# Patient Record
Sex: Male | Born: 1960 | Marital: Married | Smoking: Former smoker
Health system: Southern US, Community
[De-identification: ages and names within clinical notes are randomized; demographics above are authoritative.]

## PROBLEM LIST (undated history)

## (undated) DIAGNOSIS — E119 Type 2 diabetes mellitus without complications: Secondary | ICD-10-CM

## (undated) DIAGNOSIS — E669 Obesity, unspecified: Secondary | ICD-10-CM

## (undated) DIAGNOSIS — I10 Essential (primary) hypertension: Secondary | ICD-10-CM

## (undated) DIAGNOSIS — Q059 Spina bifida, unspecified: Secondary | ICD-10-CM

## (undated) DIAGNOSIS — Z8619 Personal history of other infectious and parasitic diseases: Secondary | ICD-10-CM

## (undated) DIAGNOSIS — R011 Cardiac murmur, unspecified: Secondary | ICD-10-CM

## (undated) DIAGNOSIS — L0390 Cellulitis, unspecified: Secondary | ICD-10-CM

## (undated) DIAGNOSIS — E291 Testicular hypofunction: Secondary | ICD-10-CM

## (undated) DIAGNOSIS — E785 Hyperlipidemia, unspecified: Secondary | ICD-10-CM

## (undated) DIAGNOSIS — G4730 Sleep apnea, unspecified: Secondary | ICD-10-CM

## (undated) DIAGNOSIS — C61 Malignant neoplasm of prostate: Secondary | ICD-10-CM

## (undated) DIAGNOSIS — D649 Anemia, unspecified: Secondary | ICD-10-CM

## (undated) DIAGNOSIS — M519 Unspecified thoracic, thoracolumbar and lumbosacral intervertebral disc disorder: Secondary | ICD-10-CM

## (undated) DIAGNOSIS — Z8679 Personal history of other diseases of the circulatory system: Secondary | ICD-10-CM

## (undated) DIAGNOSIS — S92909A Unspecified fracture of unspecified foot, initial encounter for closed fracture: Secondary | ICD-10-CM

## (undated) DIAGNOSIS — Z8719 Personal history of other diseases of the digestive system: Secondary | ICD-10-CM

## (undated) HISTORY — DX: Obesity, unspecified: E66.9

## (undated) HISTORY — DX: Type 2 diabetes mellitus without complications: E11.9

## (undated) HISTORY — PX: ABDOMINAL HERNIA REPAIR: SHX539

## (undated) HISTORY — PX: OTHER SURGICAL HISTORY: SHX169

## (undated) HISTORY — DX: Sleep apnea, unspecified: G47.30

## (undated) HISTORY — DX: Hyperlipidemia, unspecified: E78.5

## (undated) HISTORY — DX: Essential (primary) hypertension: I10

---

## 2013-07-09 HISTORY — PX: BOWEL RESECTION: SHX1257

## 2014-09-21 ENCOUNTER — Encounter: Payer: BLUE CROSS/BLUE SHIELD | Attending: Family Medicine

## 2014-09-21 VITALS — Ht 77.0 in | Wt 323.3 lb

## 2014-09-21 DIAGNOSIS — E118 Type 2 diabetes mellitus with unspecified complications: Secondary | ICD-10-CM | POA: Insufficient documentation

## 2014-09-21 DIAGNOSIS — Z713 Dietary counseling and surveillance: Secondary | ICD-10-CM | POA: Diagnosis not present

## 2014-09-21 DIAGNOSIS — IMO0002 Reserved for concepts with insufficient information to code with codable children: Secondary | ICD-10-CM

## 2014-09-21 DIAGNOSIS — E1165 Type 2 diabetes mellitus with hyperglycemia: Secondary | ICD-10-CM

## 2014-09-22 NOTE — Progress Notes (Signed)

## 2014-09-28 DIAGNOSIS — IMO0002 Reserved for concepts with insufficient information to code with codable children: Secondary | ICD-10-CM

## 2014-09-28 DIAGNOSIS — E118 Type 2 diabetes mellitus with unspecified complications: Principal | ICD-10-CM

## 2014-09-28 DIAGNOSIS — E1165 Type 2 diabetes mellitus with hyperglycemia: Secondary | ICD-10-CM

## 2014-09-28 NOTE — Progress Notes (Signed)

## 2014-10-05 DIAGNOSIS — E118 Type 2 diabetes mellitus with unspecified complications: Secondary | ICD-10-CM | POA: Diagnosis not present

## 2014-10-05 DIAGNOSIS — E119 Type 2 diabetes mellitus without complications: Secondary | ICD-10-CM

## 2014-10-05 NOTE — Progress Notes (Signed)
Patient was seen on 10/05/14 for the third of a series of three diabetes self-management courses at the [facility]. The following learning objectives were met by the patient during this class:  . State the amount of activity recommended for healthy living . Describe activities suitable for individual needs . Identify ways to regularly incorporate activity into daily life . Identify barriers to activity and ways to over come these barriers  Identify diabetes medications being personally used and their primary action for lowering glucose and possible side effects . Describe role of stress on blood glucose and develop strategies to address psychosocial issues . Identify diabetes complications and ways to prevent them  Explain how to manage diabetes during illness . Evaluate success in meeting personal goal . Establish 2-3 goals that they will plan to diligently work on until they return for the  28-monthfollow-up visit  Goals:   I will count my carb choices at most meals and snacks  I will take my diabetes medications as scheduled  Your patient has identified these potential barriers to change:  Motivation Stress Relax and give myself personal encouragement.  Your patient has identified their diabetes self-care support plan as  Family Support On-line Resources  Plan:  Attend Core 4 in 4 months

## 2015-02-03 ENCOUNTER — Ambulatory Visit: Payer: BLUE CROSS/BLUE SHIELD | Admitting: *Deleted

## 2015-07-10 DIAGNOSIS — L0390 Cellulitis, unspecified: Secondary | ICD-10-CM

## 2015-07-10 HISTORY — DX: Cellulitis, unspecified: L03.90

## 2017-06-14 HISTORY — PX: PROSTATE BIOPSY: SHX241

## 2017-08-02 ENCOUNTER — Encounter: Payer: Self-pay | Admitting: Radiation Oncology

## 2017-08-02 NOTE — Progress Notes (Signed)
Radiation Oncology         [phone] ________________________________  Initial Outpatient Consultation  Name: Lawrence Foley MRN: [id number]  Date: 08/05/2017  DOB: 07-May-1961  GE:ZMOQHU, [name], MD  Kathie Rhodes, MD   REFERRING PHYSICIAN: Kathie Rhodes, MD  DIAGNOSIS: 57 y.o. 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 Malignant neoplasm of prostate ([place]) C61     HISTORY OF PRESENT ILLNESS: Lawrence Foley is a 57 y.o. male with a diagnosis of prostate cancer. He was noted to have an elevated PSA of 5.1 in March 2018 by his primary care physician, Dr. Dione Housekeeper, that had increased from 3.5 one year prior. He was placed on a one-month course of Septra. PSA was repeated in April 2018 and found to be 5.0. Accordingly, he was referred for evaluation in urology to Dr. Karsten Ro on 12/11/2016, where digital rectal examination was performed at that time revealing no prostate nodules. The patient had been on testosterone replacement therapy for hypogonadism for at least 3 years and elected to proceed with prostate biopsy instead of having to stop his testosterone with active surveillance. The patient proceeded to transrectal ultrasound with 12 biopsies of the prostate on 01/25/2017.  The prostate volume measured 74.87 cc. Pathology revealed atypia in the left apex lateral and right apex lateral. He underwent repeat biopsy 5 months later on 06/14/2017. His PSA was repeated prior to that and measured 3.42. Out of 16 core biopsies, 3 were positive.  The maximum Gleason score was 3+3, and this was seen in 1 core of the right base lateral and in 2 cores of the right apex lateral. The left apex lateral remained atypical.  Biopsies of prostate revealed:   The patient reviewed the biopsy results with his urologist and he has kindly been referred today for discussion of potential radiation treatment options. He is  accompanied by his wife.   PREVIOUS RADIATION THERAPY: No  PAST MEDICAL HISTORY:  Past Medical History:  Diagnosis Date  . Diabetes mellitus without complication ([place])   . Hyperlipidemia   . Hypertension   . Obesity   . Prostate cancer ([place])   . Sleep apnea       PAST SURGICAL HISTORY: Past Surgical History:  Procedure Laterality Date  . BOWEL RESECTION  2015  . PROSTATE BIOPSY      FAMILY HISTORY:  Family History  Problem Relation Age of Onset  . Cancer Neg Hx     SOCIAL HISTORY:  Social History   Socioeconomic History  . Marital status: Married    Spouse name: Not on file  . Number of children: 2  . Years of education: Not on file  . Highest education level: Not on file  Social Needs  . Financial resource strain: Not on file  . Food insecurity - worry: Not on file  . Food insecurity - inability: Not on file  . Transportation needs - medical: Not on file  . Transportation needs - non-medical: Not on file  Occupational History  . Occupation: Geophysical data processor: [employer]  . Occupation: Presenter, broadcasting  Tobacco Use  . Smoking status: Former Smoker    Packs/day: 2.00    Years: 2.00    Pack years: 4.00    Types: Cigarettes    Last attempt to quit: 12/08/2007    Years since quitting: 9.6  . Smokeless tobacco: Never  Used  Substance and Sexual Activity  . Alcohol use: No    Frequency: Never  . Drug use: No  . Sexual activity: Not on file  Other Topics Concern  . Not on file  Social History Narrative   Married with two sons. Wife is a Marine scientist. Resides in [place]. Works as a Midwife for [employer].   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   ALLERGIES: Erythromycin; Rosuvastatin; and Food  MEDICATIONS:  Current Outpatient Medications  Medication Sig Dispense Refill  . acetaminophen (TYLENOL) 325 MG tablet Take by mouth.    Marland Kitchen aspirin 81 MG chewable tablet Chew by  mouth.    . canagliflozin (INVOKANA) 300 MG TABS tablet Take 300 mg by mouth daily before breakfast.    . fenofibrate micronized (LOFIBRA) 134 MG capsule Take 134 mg by mouth daily before breakfast.    . glipiZIDE (GLUCOTROL XL) 10 MG 24 hr tablet Take by mouth.    Marland Kitchen glucose blood (ONETOUCH VERIO) test strip USE TO TEST ONCE A DAY    . metFORMIN (GLUCOPHAGE-XR) 500 MG 24 hr tablet TAKE 1 TABLET TWICE A DAY    . Misc. Devices MISC One touch verios syn machine testing qd. Order diabetic testing strips and calibration. Lancets and solution with 3 month supply. Dx 250.02 with 4 refills    . Misc. Devices MISC Syringes box of 25 with 4refills dx hypgonadism, syringe size 3cc 23gx1    . Misc. Devices MISC Please change pressure from auto adjust to constant 11    . nebivolol (BYSTOLIC) 10 MG tablet TAKE 2 TABLETS DAILY    . Olmesartan-Amlodipine-HCTZ 40-5-12.5 MG TABS TAKE 1 TABLET DAILY    . ONETOUCH DELICA LANCETS FINE MISC USE TO TEST ONCE A DAY    . Pitavastatin Calcium 4 MG TABS Take by mouth.    . Respiratory Therapy Supplies DEVI CPAP at bedtime    . testosterone cypionate (DEPOTESTOSTERONE CYPIONATE) 200 MG/ML injection Inject into the muscle.     No current facility-administered medications for this encounter.     REVIEW OF SYSTEMS:  On review of systems, the patient reports that he is doing well overall. He denies any chest pain, shortness of breath, cough, fevers, chills, or night sweats. He reports 15-pound intentional weight loss over the past 3 weeks from diet change. He denies any bowel disturbances, and denies abdominal pain, nausea or vomiting. 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 His IPSS was 9, indicating moderate urinary symptoms. He denies dysuria, hematuria, leakage or incontinence. He is is to complete sexual activity with some attempts. A complete review of systems is obtained and is otherwise negative.    PHYSICAL EXAM:  Wt Readings from Last 3  Encounters:  08/05/17 (!) 313 lb 6.4 oz (142.2 kg)  09/21/14 (!) 323 lb 4.8 oz (146.6 kg)   Temp Readings from Last 3 Encounters:  08/05/17 98 F (36.7 C) (Oral)   BP Readings from Last 3 Encounters:  08/05/17 129/83   Pulse Readings from Last 3 Encounters:  08/05/17 63   Pain Assessment Pain Score: 0-No pain(see progress note about chronic pain)/10  In general this is a well appearing Caucasian man in no acute distress. He is alert and oriented x4 and appropriate throughout the examination. HEENT reveals that the patient is normocephalic, atraumatic. EOMs are intact. PERRLA. Skin is intact without any evidence of gross lesions. Cardiovascular exam reveals a regular  rate and rhythm, no clicks rubs or murmurs are auscultated. Chest is clear to auscultation bilaterally. Lymphatic assessment is performed and does not reveal any adenopathy in the cervical, supraclavicular, axillary, or inguinal chains. Abdomen has active bowel sounds in all quadrants and is intact. The abdomen is soft, non tender, non distended. Lower extremities are negative for pretibial pitting edema, deep calf tenderness, cyanosis or clubbing.   KPS = 90  100 - Normal; no complaints; no evidence of disease. 90   - Able to carry on normal activity; minor signs or symptoms of disease. 80   - Normal activity with effort; some signs or symptoms of disease. 62   - Cares for self; unable to carry on normal activity or to do active work. 60   - Requires occasional assistance, but is able to care for most of his personal needs. 50   - Requires considerable assistance and frequent medical care. 66   - Disabled; requires special care and assistance. 58   - Severely disabled; hospital admission is indicated although death not imminent. 56   - Very sick; hospital admission necessary; active supportive treatment necessary. 10   - Moribund; fatal processes progressing rapidly. 0     - Dead  Karnofsky DA, Abelmann WH, Craver LS  and Burchenal JH [phone]) The use of the nitrogen mustards in the palliative treatment of carcinoma: with particular reference to bronchogenic carcinoma Cancer 1 [phone]  LABORATORY DATA:  No results found for: WBC, HGB, HCT, MCV, PLT No results found for: NA, K, CL, CO2 No results found for: ALT, AST, GGT, ALKPHOS, BILITOT   RADIOGRAPHY: No results found.    IMPRESSION/PLAN: 1. 57 y.o. 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 We discussed the patient's workup and outlines the nature of prostate cancer in this setting. The patient's T stage, Gleason's score, and PSA put him into the favorable risk group. Accordingly, he is eligible for a variety of potential treatment options including surgery, 8 weeks of external radiation, or with downsizing his gland, a possible candidate for brachytherapy.  We discussed the available radiation techniques, and focused on the details and logistics and delivery. We discussed that based on his prostate volume, he would require beginning treatment with a 5 alpha reductase inhibitor or ADT for at least 3 months to allow for downsizing of the prostate prior to initiating radiotherapy. We would need to repeat a TRUSP after 3 months of therapy to reassess the prostate volume at that time and confirm whether he is a candidate for radiotherapy. We discussed and outlined the risks, benefits, short and long-term effects associated with radiotherapy and compared and contrasted these with prostatectomy. We discussed the role of SpaceOAR in reducing the rectal toxicity associated with radiotherapy. We also detailed the role of ADT in the treatment of favorable-risk prostate cancer and outlined the associated side effects that could be expected with this therapy.  At the end of the conversation the patient is interested in moving forward with brachytherapy, and we will contact Dr. Karsten Ro to let him know the patient's  interest in ADT.    Carola Rhine, PAC  and  Sheral Apley Tammi Klippel, M.D.  This document serves as a record of services personally performed by Tyler Pita, MD and Shona Simpson, PA-C. It was created on their behalf by Rae Lips, a trained medical scribe. The creation of this record is based on the scribe's personal observations and  the providers' statements to them. This document has been checked and approved by the attending providers.

## 2017-08-02 NOTE — Progress Notes (Addendum)
GU Location of Tumor / Histology: prostatic adenocarcinoma  If Prostate Cancer, Gleason Score is (3 + 3) and PSA is (5). Prostate volume: 75 cc.  Lawrence Foley had an initial prostate biopsy on 01/25/2017. The pathology came back atypia in the apex on the right and left sides laterally. Dr. Karsten Ro repeated a PSA and prostate biopsy on 06/14/2017 which confirmed adenocarcinoma.  09/2015  PSA  3.5 09/2016  PSA  5.1  10/2016  PSA  5   Biopsies of prostate (if applicable) revealed:    Past/Anticipated interventions by urology, if any: antibiotics for one month, prostate biopsy, repeat prostate biopsy, referral to radiation oncology  Past/Anticipated interventions by medical oncology, if any: no  Weight changes, if any: 15 lb of initial weight loss over the last thee weeks using diet modification  Bowel/Bladder complaints, if any: IPSS 9. Denies dysuria, hematuria, leakage or incontinence.   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 no  Pacemaker/ICD? no  Possible current pregnancy? no  Is the patient on methotrexate? no  Current Complaints / other details:  57 year old male. Married with two sons. Wife is a Marine scientist. Resides in [place]. Works as a Midwife for [employer].

## 2017-08-05 ENCOUNTER — Ambulatory Visit
Admission: RE | Admit: 2017-08-05 | Discharge: 2017-08-05 | Disposition: A | Discharge status: Home / Self Care | Payer: BLUE CROSS/BLUE SHIELD | Source: Ambulatory Visit | Attending: Radiation Oncology | Admitting: Radiation Oncology

## 2017-08-05 ENCOUNTER — Telehealth: Payer: Self-pay | Admitting: *Deleted

## 2017-08-05 ENCOUNTER — Encounter: Payer: Self-pay | Admitting: Medical Oncology

## 2017-08-05 ENCOUNTER — Encounter: Payer: Self-pay | Admitting: Radiation Oncology

## 2017-08-05 ENCOUNTER — Other Ambulatory Visit: Payer: Self-pay

## 2017-08-05 VITALS — BP 129/83 | HR 63 | Temp 98.0°F | Resp 18 | Ht 77.0 in | Wt 313.4 lb

## 2017-08-05 DIAGNOSIS — Z881 Allergy status to other antibiotic agents status: Secondary | ICD-10-CM | POA: Diagnosis not present

## 2017-08-05 DIAGNOSIS — Z7984 Long term (current) use of oral hypoglycemic drugs: Secondary | ICD-10-CM | POA: Diagnosis not present

## 2017-08-05 DIAGNOSIS — I10 Essential (primary) hypertension: Secondary | ICD-10-CM | POA: Insufficient documentation

## 2017-08-05 DIAGNOSIS — E119 Type 2 diabetes mellitus without complications: Secondary | ICD-10-CM | POA: Insufficient documentation

## 2017-08-05 DIAGNOSIS — Z79899 Other long term (current) drug therapy: Secondary | ICD-10-CM | POA: Diagnosis not present

## 2017-08-05 DIAGNOSIS — C61 Malignant neoplasm of prostate: Secondary | ICD-10-CM | POA: Insufficient documentation

## 2017-08-05 DIAGNOSIS — E785 Hyperlipidemia, unspecified: Secondary | ICD-10-CM | POA: Insufficient documentation

## 2017-08-05 DIAGNOSIS — Z87891 Personal history of nicotine dependence: Secondary | ICD-10-CM | POA: Insufficient documentation

## 2017-08-05 DIAGNOSIS — G4730 Sleep apnea, unspecified: Secondary | ICD-10-CM | POA: Diagnosis not present

## 2017-08-05 DIAGNOSIS — Z7982 Long term (current) use of aspirin: Secondary | ICD-10-CM | POA: Insufficient documentation

## 2017-08-05 DIAGNOSIS — E669 Obesity, unspecified: Secondary | ICD-10-CM | POA: Insufficient documentation

## 2017-08-05 HISTORY — DX: Malignant neoplasm of prostate: C61

## 2017-08-05 NOTE — Progress Notes (Signed)
See progress note under physician encounter. 

## 2017-08-05 NOTE — Progress Notes (Signed)
Introduced myself to Lawrence Foley and his wife as the prostate nurse navigator and my role. He is very interested in brachytherapy but his prostate volume is 74.87. He is hoping that his prostate can be down sized with Lupron so he will be a candidate for brachytherapy and if not he will do 8 week of external beam. Enid Derry was able to get him an appointment for Lupron injection 08/08/17. I gave him my business card and asked him to call with questions or concerns. I will continue to follow.

## 2017-08-05 NOTE — Telephone Encounter (Signed)
Called patient to inform of Lupron Inj. on 08-08-17 - arrival time - 7:30 am @ Dr. Simone Curia Office, spoke with patient and he is aware of this appt.

## 2017-08-12 ENCOUNTER — Telehealth: Payer: Self-pay | Admitting: Radiation Oncology

## 2017-08-12 NOTE — Telephone Encounter (Signed)
Phoned Dr. Simone Curia office to check status of Lupron and ultrasound. Dr. Simone Curia nurse, Davy Pique, reports the patient hasn't been scheduled for either and provides an appointment for Wednesday at 1015 for Lupron. Phoned patient to provide appointment. Patient reports he received his Lupron injection on January 31. In addition, he reports he is scheduled for an ultrasound on May 10th. Phoned Sonya back and requested she cancel the appointment for Wednesday.

## 2017-08-12 NOTE — Telephone Encounter (Signed)
-----   Message from Hayden Pedro, [place] sent at 08/05/2017 12:02 PM EST ----- Dr. Karsten Ro,  Mr. Bale is interested in Lupron rather than an alpha blocker to downsize his gland to be a candidate for seed implant. Can you have your nurse let us know once he receives this and when his next ultrasound is to confirm downsizing? Thanks, Bryson Ha

## 2017-08-14 ENCOUNTER — Encounter: Payer: Self-pay | Admitting: Medical Oncology

## 2017-09-26 ENCOUNTER — Telehealth: Payer: Self-pay | Admitting: *Deleted

## 2017-09-26 NOTE — Telephone Encounter (Signed)
CALLED PATIENT TO INFORM OF ULTRASOUND APPT. FOR 11-15-17 - ARRIVAL TIME- 2 PM  @ [facility], SPOKE WITH PATIENT AND HE IS AWARE OF THIS APPT.

## 2017-11-25 ENCOUNTER — Telehealth: Payer: Self-pay | Admitting: Radiation Oncology

## 2017-11-25 ENCOUNTER — Telehealth: Payer: Self-pay | Admitting: *Deleted

## 2017-11-25 NOTE — Telephone Encounter (Signed)
I called the pt to confirm his gland size has lessened down to 39.9 cc. He was scheduled for follow up new with Ashlyn on Thursday but is ready to move forward and states he feels well informed at this point that he'd like to just go ahead and start the scheduling process. Scheduling is notified to coordinate seed implant and to cancel this Thursday's appt.

## 2017-11-25 NOTE — Telephone Encounter (Signed)
CALLED Lawrence Foley AND INFORMED HER THAT ALISON PERKINS HAD ME CANCEL THE APPT. ON 11-28-17, Lawrence VERIFIED UNDERSTANDING THIS.

## 2017-11-28 ENCOUNTER — Ambulatory Visit: Payer: BLUE CROSS/BLUE SHIELD | Admitting: Urology

## 2017-11-28 ENCOUNTER — Ambulatory Visit: Payer: BLUE CROSS/BLUE SHIELD

## 2017-12-18 ENCOUNTER — Other Ambulatory Visit: Payer: Self-pay | Admitting: Urology

## 2017-12-25 ENCOUNTER — Telehealth: Payer: Self-pay | Admitting: *Deleted

## 2017-12-25 NOTE — Telephone Encounter (Signed)
Called patient to alter pre-seed appt. per Freeman Caldron , rescheduled for 12-26-17, patient agreed to new time and date

## 2017-12-26 ENCOUNTER — Ambulatory Visit
Admission: RE | Admit: 2017-12-26 | Discharge: 2017-12-26 | Disposition: A | Discharge status: Home / Self Care | Payer: BLUE CROSS/BLUE SHIELD | Source: Ambulatory Visit | Attending: Radiation Oncology | Admitting: Radiation Oncology

## 2017-12-26 ENCOUNTER — Encounter: Payer: Self-pay | Admitting: Medical Oncology

## 2017-12-26 DIAGNOSIS — C61 Malignant neoplasm of prostate: Secondary | ICD-10-CM | POA: Diagnosis present

## 2017-12-26 NOTE — Progress Notes (Signed)
  Radiation Oncology         [phone] ________________________________  Name: Lawrence Foley MRN: [id number]  Date: 12/26/2017  DOB: 10/10/1960  SIMULATION AND TREATMENT PLANNING NOTE PUBIC ARCH STUDY  TA:VWPVXY, [name], MD  Kathie Rhodes, MD  DIAGNOSIS: 57 y.o. 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    ICD-10-CM   1. Malignant neoplasm of prostate ([place]) C61     COMPLEX SIMULATION:  The patient presented today for evaluation for possible prostate seed implant. He was brought to the radiation planning suite and placed supine on the CT couch. A 3-dimensional image study set was obtained in upload to the planning computer. There, on each axial slice, I contoured the prostate gland. Then, using three-dimensional radiation planning tools I reconstructed the prostate in view of the structures from the transperineal needle pathway to assess for possible pubic arch interference. In doing so, I did not appreciate any pubic arch interference. Also, the patient's prostate volume was estimated based on the drawn structure. The volume was 44 cc.  Given the pubic arch appearance and prostate volume, patient remains a good candidate to proceed with prostate seed implant. Today, he freely provided informed written consent to proceed.    PLAN: The patient will undergo prostate seed implant to 145 Gy.   ________________________________  Sheral Apley. Tammi Klippel, M.D.   This document serves as a record of services personally performed by Tyler Pita MD. It was created on his behalf by Delton Coombes, a trained medical scribe. The creation of this record is based on the scribe's personal observations and the provider's statements to them.

## 2017-12-27 ENCOUNTER — Ambulatory Visit: Payer: BLUE CROSS/BLUE SHIELD | Admitting: Radiation Oncology

## 2017-12-27 ENCOUNTER — Telehealth: Payer: Self-pay | Admitting: *Deleted

## 2017-12-27 NOTE — Telephone Encounter (Signed)
Called patient to inform of appt. for chest x-ray and EKG for 01-17-18, lvm for a return call

## 2018-01-10 ENCOUNTER — Telehealth: Payer: Self-pay | Admitting: *Deleted

## 2018-01-10 NOTE — Telephone Encounter (Signed)
Called patient to remind of his chest x-ray and EKG for 01-17-18- arrival time- 1:45 pm @ [facility] Admitting, spoke with patient and she is aware of these appts.

## 2018-01-17 ENCOUNTER — Encounter (HOSPITAL_COMMUNITY)
Admission: RE | Admit: 2018-01-17 | Discharge: 2018-01-17 | Disposition: A | Discharge status: Home / Self Care | Payer: BLUE CROSS/BLUE SHIELD | Source: Ambulatory Visit | Attending: Urology | Admitting: Urology

## 2018-01-17 ENCOUNTER — Ambulatory Visit (HOSPITAL_COMMUNITY)
Admission: RE | Admit: 2018-01-17 | Discharge: 2018-01-17 | Disposition: A | Discharge status: Home / Self Care | Payer: BLUE CROSS/BLUE SHIELD | Source: Ambulatory Visit | Attending: Urology | Admitting: Urology

## 2018-01-17 DIAGNOSIS — C61 Malignant neoplasm of prostate: Secondary | ICD-10-CM | POA: Insufficient documentation

## 2018-02-18 ENCOUNTER — Other Ambulatory Visit: Payer: Self-pay | Admitting: Urology

## 2018-02-18 DIAGNOSIS — C61 Malignant neoplasm of prostate: Secondary | ICD-10-CM

## 2018-02-19 ENCOUNTER — Telehealth: Payer: Self-pay | Admitting: *Deleted

## 2018-02-19 NOTE — Telephone Encounter (Signed)
RETURNED PATIENT'S PHONE CALL, SPOKE WITH PATIENT. ?

## 2018-02-27 ENCOUNTER — Telehealth: Payer: Self-pay | Admitting: *Deleted

## 2018-02-27 NOTE — Telephone Encounter (Signed)
CALLED PATIENT TO REMIND OF LAB APPT. FOR 02-28-18 - ARRIVAL TIME - 1:45 PM @ [facility] ADMITTING, LVM FOR A RETURN CALL

## 2018-02-28 ENCOUNTER — Encounter (HOSPITAL_BASED_OUTPATIENT_CLINIC_OR_DEPARTMENT_OTHER): Payer: Self-pay

## 2018-02-28 ENCOUNTER — Encounter (HOSPITAL_COMMUNITY)
Admission: RE | Admit: 2018-02-28 | Discharge: 2018-02-28 | Disposition: A | Discharge status: Home / Self Care | Payer: BLUE CROSS/BLUE SHIELD | Source: Ambulatory Visit | Attending: Urology | Admitting: Urology

## 2018-02-28 ENCOUNTER — Other Ambulatory Visit: Payer: Self-pay

## 2018-02-28 DIAGNOSIS — Z01812 Encounter for preprocedural laboratory examination: Secondary | ICD-10-CM | POA: Insufficient documentation

## 2018-02-28 LAB — COMPREHENSIVE METABOLIC PANEL
ALT: 20 U/L (ref 0–44)
AST: 27 U/L (ref 15–41)
Albumin: 4.4 g/dL (ref 3.5–5.0)
Alkaline Phosphatase: 44 U/L (ref 38–126)
Anion gap: 7 (ref 5–15)
BUN: 29 mg/dL — ABNORMAL HIGH (ref 6–20)
CO2: 29 mmol/L (ref 22–32)
Calcium: 9.8 mg/dL (ref 8.9–10.3)
Chloride: 108 mmol/L (ref 98–111)
Creatinine, Ser: 1.11 mg/dL (ref 0.61–1.24)
GFR calc Af Amer: >60 mL/min (ref >60)
GFR calc non Af Amer: >60 mL/min (ref >60)
Glucose, Bld: 121 mg/dL — ABNORMAL HIGH (ref 70–99)
Potassium: 4.6 mmol/L (ref 3.5–5.1)
Sodium: 144 mmol/L (ref 135–145)
Total Bilirubin: 0.5 mg/dL (ref 0.3–1.2)
Total Protein: 7.2 g/dL (ref 6.5–8.1)

## 2018-02-28 LAB — CBC
HCT: 39.3 % (ref 39.0–52.0)
Hemoglobin: 13.5 g/dL (ref 13.0–17.0)
MCH: 31.1 pg (ref 26.0–34.0)
MCHC: 34.4 g/dL (ref 30.0–36.0)
MCV: 90.6 fL (ref 78.0–100.0)
Platelets: 209 10*3/uL (ref 150–400)
RBC: 4.34 MIL/uL (ref 4.22–5.81)
RDW: 12.2 % (ref 11.5–15.5)
WBC: 4.9 10*3/uL (ref 4.0–10.5)

## 2018-02-28 LAB — PROTIME-INR
INR: 1.02
Prothrombin Time: 13.3 seconds (ref 11.4–15.2)

## 2018-02-28 LAB — APTT: aPTT: 30 seconds (ref 24–36)

## 2018-02-28 NOTE — Progress Notes (Signed)
CMP results 02/28/2018 faxed to Dr. Karsten Ro via epic.

## 2018-02-28 NOTE — Progress Notes (Signed)
Spoke with:  Lawrence Foley NPO:  After Midnight, no gum, candy, or mints  Arrival time: 0730AM Labs:  (EKG/CXR 01/17/18, CBC, CMP, PT, PTT 02/28/2018 in epic) AM medications:  Livalo Pre op orders: Yes Ride home:  Lawrence Foley (wife) [phone]

## 2018-02-28 NOTE — Pre-Procedure Instructions (Signed)
Mr. Ostermiller came in today for lab work, we did his pre op interview face to face will he was here.  A copy of these instructions were given: Your procedure is scheduled on:  Friday, Aug. 30, 2019  Report to [facility] AT  7:30 AM   Call this number if you have problems the morning of surgery:[phone].   OUR ADDRESS IS [address].  WE ARE LOCATED IN THE [place]                                   [address] [address].  HAVE PICTURE ID AND MEDICAL INSURANCE CARD                                     REMEMBER:  DO NOT EAT FOOD OR DRINK LIQUIDS AFTER MIDNIGHT .  (NO GUM, CANDY, OR MINTS, MAY BRUSH YOUR TEETH)  TAKE THESE MEDICATIONS MORNING OF SURGERY WITH A SIP OF WATER:  Livalo  TAKE A FLEET ENEMA AT 6:30AM  DO NOT TAKE ANY DIABETIC MEDICATIONS THE MORNING OF SURGERY  Bring CPAP machine day of surgery  DO NOT WEAR JEWERLY, MAKE UP, OR NAIL POLISH.  DO NOT WEAR LOTIONS, POWDERS, PERFUMES/COLOGNE OR DEODORANT.  MEN MAY SHAVE FACE AND NECK.  CONTACTS, GLASSES, OR DENTURES MAY NOT BE WORN TO SURGERY.  HAVE A RESPONSIBLE ADULT TO DRIVE YOU HOME AND STAY WITH YOU AFTER YOUR PROCEDURE FOR YOUR SAFETY.                                    [facility] IS NOT RESPONSIBLE  FOR ANY BELONGINGS.                                                                    Marland Kitchen

## 2018-03-03 NOTE — Pre-Procedure Instructions (Signed)
CMP results 02/28/2018 faxed to Dr. Karsten Ro via epic.

## 2018-03-06 ENCOUNTER — Telehealth: Payer: Self-pay | Admitting: *Deleted

## 2018-03-06 NOTE — H&P (Signed)
HPI: Lawrence Foley is a 57 year-old male with prostate cancer.  His prostate cancer was diagnosed 06/18/2017. His PSA at his time of diagnosis was 3.42. His cancer was Gleason 3+ 3 = 6 in 2 cores (10% and 5%) from the left lobe..   He has not undergone surgery for treatment. He has not undergone External Beam Radiation Therapy for treatment. He has not undergone Hormonal Therapy for treatment.   He has not recently had unwanted weight loss. He is not having new bone pain.   Prostate cancer: While on testosterone replacement his PSA was found to be elevated to 5.0. No abnormality on DRE.  TRUS/BX 01/25/17: Prostate volume - 75 cc  Pathology - Atypia in the apex on the right and left sides laterally.  Due to the presence of atypia and his young age I recommended a repeat biopsy.  TRUS/Bx 06/18/17: His prostate measured 104 cc.  Pathology: Adenocarcinoma Gleason 3+ 3 = 6 in 2 cores (10% and 5%) from the left lobe.  Stage: T1c   08/08/17: After meeting with Dr. Tammi Klippel. He has elected to proceed with radioactive seed implant if his prostate can be downsized enough to allow this to be performed. He said he stopped testosterone injections 3 weeks ago. The reason he started testosterone replacement was due to excessive fatigue.   11/15/17: After having met with Dr. Tammi Klippel he has elected to proceed with brachytherapy. Because his prostate volume was 75 cc the need for downsizing was discussed and he received a 3 month Lupron injection on 08/08/17. He returns today for repeat TRUS to evaluate his prostate size. He reports he has not experienced any hot flashes. He remains off testosterone replacement.     ALLERGIES: Mycins - Hives, Itching    MEDICATIONS: Aspirin  Metformin Hcl  Bystolic  Fenofibrate  Glipizide  Invokana  Levaquin 750 mg tablet Take the morning of your prostate biopsy.  Livalo  Losartan Potassium  Olmesartan     GU PSH: Prostate Needle Biopsy - 06/14/2017, 01/25/2017       PSH Notes: Small bowel resection (2015)   NON-GU PSH: Hernia Repair, 2017 Surgical Pathology, Gross And Microscopic Examination For Prostate Needle - 06/14/2017, 01/25/2017    GU PMH: Prostate Cancer (Stable), Weighing his options he has elected to proceed with a 3 month Lupron injection. - 08/08/2017 Elevated PSA (Stable), I have discussed with the patient the possibility of blood per rectum, per urethra and in the ejaculate. He was counseled to contact me if he has any difficulties following his prostate biopsy whatsoever. - 06/14/2017, (Stable), He has a history of an elevated PSA with a very high free to total ratio indicating possibly benign causes for his PSA elevation however with a biopsy that has revealed atypia I will repeat his PSA again in 3 months and have recommended a repeat prostate biopsy., - 02/08/2017 (Stable), I have discussed with the patient the possibility of blood per rectum, per urethra and in the ejaculate. He was counseled to contact me if he has any difficulties following his prostate biopsy whatsoever., - 01/25/2017, I have discussed with the patient the possible need for further evaluation of his elevated PSA. We have discussed the options which would be continued observation with serial DRE and PSAs versus proceeding with further evaluation at this time with TRUS/Bx. We have discussed the possible risk of progression and spread of prostate cancer if present currently as well as the fact that typically prostate cancer tends to be a relatively  slow-growing form of cancer that typically would not progress significantly over the relative short period of time between serial examinations. We also have discussed proceeding at this time with a prostate biopsy and I therefore have gone over the procedure with him in detail. We have discussed its potential risks and complications as well as limitations. , - 12/11/2016 Atypia of the prostate - 01/25/2017 Primary hypogonadism, We discussed the  fact that testosterone replacement does not cause prostate cancer but can cause prostate cancer, if present, to have an increased growth rate. Because of that fact I have indicated to him that his options would be to either stop testosterone replacement and then see how this affects his PSA or if he wishes to continue with testosterone replacement my recommendation would be that he proceeds with evaluation with TRUS/BX. - 12/11/2016      PMH Notes: Hypogonadism: He had been receiving IM injections 200 mg/2 weeks since 2015.     NON-GU PMH: Diabetes Type 2 GERD Hypercholesterolemia Hypertension Sleep Apnea    FAMILY HISTORY: 2 sons - Son   SOCIAL HISTORY: Marital Status: Married Preferred Language: English; Ethnicity: Not Hispanic Or Latino; Race: White Current Smoking Status: Patient does not smoke anymore. Has not smoked since 12/08/2007. Smoked for 36 years. Smoked 2 packs per day.   Tobacco Use Assessment Completed: Used Tobacco in last 30 days? Does not drink anymore.  Drinks 3 caffeinated drinks per day.    REVIEW OF SYSTEMS:    GU Review Male:   Patient denies frequent urination, hard to postpone urination, burning/ pain with urination, get up at night to urinate, leakage of urine, stream starts and stops, trouble starting your stream, have to strain to urinate , erection problems, and penile pain.  Gastrointestinal (Upper):   Patient denies nausea, vomiting, and indigestion/ heartburn.  Gastrointestinal (Lower):   Patient denies diarrhea and constipation.  Constitutional:   Patient denies fever, night sweats, weight loss, and fatigue.  Skin:   Patient denies skin rash/ lesion and itching.  Eyes:   Patient denies blurred vision and double vision.  Ears/ Nose/ Throat:   Patient denies sore throat and sinus problems.  Hematologic/Lymphatic:   Patient denies swollen glands and easy bruising.  Cardiovascular:   Patient denies leg swelling and chest pains.  Respiratory:   Patient  denies cough and shortness of breath.  Endocrine:   Patient denies excessive thirst.  Musculoskeletal:   Patient denies back pain and joint pain.  Neurological:   Patient denies headaches and dizziness.  Psychologic:   Patient denies depression and anxiety.   VITAL SIGNS:    Weight 297 lb / 134.72 kg  Height 77 in / 195.58 cm  BP 110/72 mmHg  Pulse 61 /min  BMI 35.2 kg/m   GU PHYSICAL EXAMINATION:    Anus and Perineum: No hemorrhoids. No anal stenosis. No rectal fissure, no anal fissure. No edema, no dimple, no perineal tenderness, no anal tenderness.  Scrotum: No lesions. No edema. No cysts. No warts.  Epididymides: Right: no spermatocele, no masses, no cysts, no tenderness, no induration, no enlargement. Left: no spermatocele, no masses, no cysts, no tenderness, no induration, no enlargement.  Testes: No tenderness, no swelling, no enlargement left testes. No tenderness, no swelling, no enlargement right testes. Normal location left testes. Normal location right testes. No mass, no cyst, no varicocele, no hydrocele left testes. No mass, no cyst, no varicocele, no hydrocele right testes.  Urethral Meatus: Normal size. No lesion, no wart, no discharge,  no polyp. Normal location.  Penis: Circumcised, no warts, no cracks. No dorsal Peyronie's plaques, no left corporal Peyronie's plaques, no right corporal Peyronie's plaques, no scarring, no warts. No balanitis, no meatal stenosis.  Prostate: 40 gram or 2+ size. Left lobe normal consistency, right lobe normal consistency. Symmetrical lobes. No prostate nodule. Left lobe no tenderness, right lobe no tenderness.   Seminal Vesicles: Nonpalpable.  Sphincter Tone: Normal sphincter. No rectal tenderness. No rectal mass.    MULTI-SYSTEM PHYSICAL EXAMINATION:    Constitutional: Well-nourished. No physical deformities. Normally developed. Good grooming.  Neck: Neck symmetrical, not swollen. Normal tracheal position.  Respiratory: No labored  breathing, no use of accessory muscles.   Cardiovascular: Normal temperature, normal extremity pulses, no swelling, no varicosities.  Lymphatic: No enlargement of neck, axillae, groin.  Skin: No paleness, no jaundice, no cyanosis. No lesion, no ulcer, no rash.  Neurologic / Psychiatric: Oriented to time, oriented to place, oriented to person. No depression, no anxiety, no agitation.  Gastrointestinal: No mass, no tenderness, no rigidity, non obese abdomen.  Eyes: Normal conjunctivae. Normal eyelids.  Ears, Nose, Mouth, and Throat: Left ear no scars, no lesions, no masses. Right ear no scars, no lesions, no masses. Nose no scars, no lesions, no masses. Normal hearing. Normal lips.  Musculoskeletal: Normal gait and station of head and neck.    PAST DATA REVIEWED:  Source Of History:  Patient   06/07/17 10/12/16 09/11/16 12/08/15  PSA  Total PSA 3.42 ng/mL 5.0 ng/dl 5.1 ng/dl 3.5 ng/dl  % Free PSA  37.2 %      PROCEDURES:         Prostate Ultrasound - 30092  Length:4.35 Height:3.43 Width:5.10 Volume:39.94ML      Prostate appears Heterogeneous. ? Multiple hyperechoic areas 1) 2.3x2.0x2.4cm ( no blood flow noted) 2) 1.7x1.7x1.6cm ( no blood flow noted)   The transrectal ultrasound probe is introduced into the rectum, and the prostate is visualized. Ultrasonography is utilized throughout the procedure. At the conclusion of the procedure, the ultrasound probe is removed. The patient tolerates the procedure without complication.         Urinalysis Dipstick Dipstick Cont'd  Color: Yellow Bilirubin: Neg  Appearance: Clear Ketones: Neg  Specific Gravity: 1.020 Blood: Neg  pH: 6.0 Protein: Neg  Glucose: Neg Urobilinogen: 0.2    Nitrites: Neg    Leukocyte Esterase: Neg    ASSESSMENT/PLAN:      ICD-10 Details  1 GU:   Prostate Cancer - C61 Stable - He is anxious to proceed with radioactive seed implantation for treatment of his prostate cancer.   2   BPH w/o LUTS - N40.0 Stable - His  enlarged prostate has now decreased significantly. By ultrasound the volume measured 40 cc. I told him it was of a size that he could then undergo radioactive seed implantation.

## 2018-03-06 NOTE — Telephone Encounter (Signed)
CALLED PATIENT TO REMIND OF IMPLANT FOR 03-07-18, SPOKE WITH PATIENT AND HE IS AWARE OF THIS PROCEDURE

## 2018-03-07 ENCOUNTER — Encounter (HOSPITAL_BASED_OUTPATIENT_CLINIC_OR_DEPARTMENT_OTHER): Payer: Self-pay | Admitting: Anesthesiology

## 2018-03-07 ENCOUNTER — Ambulatory Visit (HOSPITAL_BASED_OUTPATIENT_CLINIC_OR_DEPARTMENT_OTHER): Payer: BLUE CROSS/BLUE SHIELD | Admitting: Anesthesiology

## 2018-03-07 ENCOUNTER — Encounter (HOSPITAL_BASED_OUTPATIENT_CLINIC_OR_DEPARTMENT_OTHER)
Admission: RE | Disposition: A | Discharge status: Home / Self Care | Payer: Self-pay | Source: Ambulatory Visit | Attending: Urology

## 2018-03-07 ENCOUNTER — Ambulatory Visit (HOSPITAL_BASED_OUTPATIENT_CLINIC_OR_DEPARTMENT_OTHER)
Admission: RE | Admit: 2018-03-07 | Discharge: 2018-03-07 | Disposition: A | Discharge status: Home / Self Care | Payer: BLUE CROSS/BLUE SHIELD | Source: Ambulatory Visit | Attending: Urology | Admitting: Urology

## 2018-03-07 ENCOUNTER — Ambulatory Visit (HOSPITAL_COMMUNITY): Payer: BLUE CROSS/BLUE SHIELD

## 2018-03-07 ENCOUNTER — Other Ambulatory Visit: Payer: Self-pay

## 2018-03-07 DIAGNOSIS — E119 Type 2 diabetes mellitus without complications: Secondary | ICD-10-CM | POA: Diagnosis not present

## 2018-03-07 DIAGNOSIS — Z87891 Personal history of nicotine dependence: Secondary | ICD-10-CM | POA: Diagnosis not present

## 2018-03-07 DIAGNOSIS — G4730 Sleep apnea, unspecified: Secondary | ICD-10-CM | POA: Insufficient documentation

## 2018-03-07 DIAGNOSIS — I10 Essential (primary) hypertension: Secondary | ICD-10-CM | POA: Insufficient documentation

## 2018-03-07 DIAGNOSIS — Z7984 Long term (current) use of oral hypoglycemic drugs: Secondary | ICD-10-CM | POA: Diagnosis not present

## 2018-03-07 DIAGNOSIS — K219 Gastro-esophageal reflux disease without esophagitis: Secondary | ICD-10-CM | POA: Diagnosis not present

## 2018-03-07 DIAGNOSIS — Z7982 Long term (current) use of aspirin: Secondary | ICD-10-CM | POA: Diagnosis not present

## 2018-03-07 DIAGNOSIS — Z79899 Other long term (current) drug therapy: Secondary | ICD-10-CM | POA: Insufficient documentation

## 2018-03-07 DIAGNOSIS — E7800 Pure hypercholesterolemia, unspecified: Secondary | ICD-10-CM | POA: Diagnosis not present

## 2018-03-07 DIAGNOSIS — C61 Malignant neoplasm of prostate: Secondary | ICD-10-CM | POA: Insufficient documentation

## 2018-03-07 HISTORY — DX: Personal history of other infectious and parasitic diseases: Z86.19

## 2018-03-07 HISTORY — DX: Testicular hypofunction: E29.1

## 2018-03-07 HISTORY — PX: CYSTOSCOPY: SHX5120

## 2018-03-07 HISTORY — DX: Cellulitis, unspecified: L03.90

## 2018-03-07 HISTORY — DX: Unspecified thoracic, thoracolumbar and lumbosacral intervertebral disc disorder: M51.9

## 2018-03-07 HISTORY — DX: Spina bifida, unspecified: Q05.9

## 2018-03-07 HISTORY — DX: Personal history of other diseases of the circulatory system: Z86.79

## 2018-03-07 HISTORY — PX: RADIOACTIVE SEED IMPLANT: SHX5150

## 2018-03-07 HISTORY — PX: SPACE OAR INSTILLATION: SHX6769

## 2018-03-07 HISTORY — DX: Anemia, unspecified: D64.9

## 2018-03-07 HISTORY — DX: Personal history of other diseases of the digestive system: Z87.19

## 2018-03-07 HISTORY — DX: Cardiac murmur, unspecified: R01.1

## 2018-03-07 HISTORY — DX: Unspecified fracture of unspecified foot, initial encounter for closed fracture: S92.909A

## 2018-03-07 LAB — POCT I-STAT, CHEM 8
BUN: 25 mg/dL — ABNORMAL HIGH (ref 6–20)
Calcium, Ion: 1.29 mmol/L (ref 1.15–1.40)
Chloride: 106 mmol/L (ref 98–111)
Creatinine, Ser: 1.1 mg/dL (ref 0.61–1.24)
Glucose, Bld: 173 mg/dL — ABNORMAL HIGH (ref 70–99)
HCT: 37 % — ABNORMAL LOW (ref 39.0–52.0)
Hemoglobin: 12.6 g/dL — ABNORMAL LOW (ref 13.0–17.0)
Potassium: 4.1 mmol/L (ref 3.5–5.1)
Sodium: 142 mmol/L (ref 135–145)
TCO2: 23 mmol/L (ref 22–32)

## 2018-03-07 LAB — GLUCOSE, CAPILLARY: Glucose-Capillary: 155 mg/dL — ABNORMAL HIGH (ref 70–99)

## 2018-03-07 SURGERY — INSERTION, RADIATION SOURCE, PROSTATE
Anesthesia: General | Site: Rectum

## 2018-03-07 MED ORDER — DEXAMETHASONE SODIUM PHOSPHATE 4 MG/ML IJ SOLN
INTRAMUSCULAR | Status: DC | PRN
  Administered 2018-03-07: 10 mg via INTRAVENOUS

## 2018-03-07 MED ORDER — OXYCODONE HCL 5 MG/5ML PO SOLN
5.0000 mg | Freq: Once | ORAL | Status: AC | PRN
  Filled 2018-03-07: qty 5

## 2018-03-07 MED ORDER — LIDOCAINE 2% (20 MG/ML) 5 ML SYRINGE
INTRAMUSCULAR | Status: AC
  Filled 2018-03-07: qty 5

## 2018-03-07 MED ORDER — PROPOFOL 10 MG/ML IV BOLUS
INTRAVENOUS | Status: AC
  Filled 2018-03-07: qty 40

## 2018-03-07 MED ORDER — MEPERIDINE HCL 25 MG/ML IJ SOLN
6.2500 mg | INTRAMUSCULAR | Status: DC | PRN
  Filled 2018-03-07: qty 1

## 2018-03-07 MED ORDER — CIPROFLOXACIN HCL 500 MG PO TABS: 500.0000 mg | ORAL_TABLET | Freq: Two times a day (BID) | ORAL | 0 refills | Status: AC

## 2018-03-07 MED ORDER — LIDOCAINE 2% (20 MG/ML) 5 ML SYRINGE
INTRAMUSCULAR | Status: DC | PRN
  Administered 2018-03-07: 100 mg via INTRAVENOUS

## 2018-03-07 MED ORDER — OXYCODONE HCL 5 MG PO TABS
5.0000 mg | ORAL_TABLET | Freq: Once | ORAL | Status: AC | PRN
  Administered 2018-03-07: 5 mg via ORAL
  Filled 2018-03-07: qty 1

## 2018-03-07 MED ORDER — EPHEDRINE SULFATE-NACL 50-0.9 MG/10ML-% IV SOSY
PREFILLED_SYRINGE | INTRAVENOUS | Status: DC | PRN
  Administered 2018-03-07 (×4): 10 mg via INTRAVENOUS

## 2018-03-07 MED ORDER — FLEET ENEMA 7-19 GM/118ML RE ENEM
1.0000 | ENEMA | Freq: Once | RECTAL | Status: DC
  Filled 2018-03-07: qty 1

## 2018-03-07 MED ORDER — CIPROFLOXACIN IN D5W 400 MG/200ML IV SOLN
400.0000 mg | INTRAVENOUS | Status: AC
  Administered 2018-03-07: 400 mg via INTRAVENOUS
  Filled 2018-03-07: qty 200

## 2018-03-07 MED ORDER — SODIUM CHLORIDE 0.9 % IR SOLN
Status: DC | PRN
  Administered 2018-03-07: 1000 mL via INTRAVESICAL

## 2018-03-07 MED ORDER — IOHEXOL 300 MG/ML  SOLN
INTRAMUSCULAR | Status: DC | PRN
  Administered 2018-03-07: 7 mL

## 2018-03-07 MED ORDER — MIDAZOLAM HCL 5 MG/5ML IJ SOLN
INTRAMUSCULAR | Status: DC | PRN
  Administered 2018-03-07: 2 mg via INTRAVENOUS

## 2018-03-07 MED ORDER — HYDROMORPHONE HCL 1 MG/ML IJ SOLN
0.2500 mg | INTRAMUSCULAR | Status: DC | PRN
  Administered 2018-03-07: 0.25 mg via INTRAVENOUS
  Filled 2018-03-07: qty 0.5

## 2018-03-07 MED ORDER — FENTANYL CITRATE (PF) 100 MCG/2ML IJ SOLN
INTRAMUSCULAR | Status: DC | PRN
  Administered 2018-03-07 (×2): 50 ug via INTRAVENOUS

## 2018-03-07 MED ORDER — LACTATED RINGERS IV SOLN
INTRAVENOUS | Status: DC
  Administered 2018-03-07: 09:00:00 via INTRAVENOUS
  Filled 2018-03-07: qty 1000

## 2018-03-07 MED ORDER — OXYCODONE HCL 5 MG PO TABS
ORAL_TABLET | ORAL | Status: AC
  Filled 2018-03-07: qty 1

## 2018-03-07 MED ORDER — SODIUM CHLORIDE 0.9 % IJ SOLN
INTRAMUSCULAR | Status: DC | PRN
  Administered 2018-03-07: 10 mL

## 2018-03-07 MED ORDER — CIPROFLOXACIN IN D5W 400 MG/200ML IV SOLN
INTRAVENOUS | Status: AC
  Filled 2018-03-07: qty 200

## 2018-03-07 MED ORDER — ONDANSETRON HCL 4 MG/2ML IJ SOLN
INTRAMUSCULAR | Status: AC
  Filled 2018-03-07: qty 2

## 2018-03-07 MED ORDER — MIDAZOLAM HCL 2 MG/2ML IJ SOLN
INTRAMUSCULAR | Status: AC
  Filled 2018-03-07: qty 2

## 2018-03-07 MED ORDER — PROPOFOL 10 MG/ML IV BOLUS
INTRAVENOUS | Status: DC | PRN
  Administered 2018-03-07: 50 mg via INTRAVENOUS
  Administered 2018-03-07: 250 mg via INTRAVENOUS

## 2018-03-07 MED ORDER — PROMETHAZINE HCL 25 MG/ML IJ SOLN
6.2500 mg | INTRAMUSCULAR | Status: DC | PRN
  Filled 2018-03-07: qty 1

## 2018-03-07 MED ORDER — ONDANSETRON HCL 4 MG/2ML IJ SOLN
INTRAMUSCULAR | Status: DC | PRN
  Administered 2018-03-07: 4 mg via INTRAVENOUS

## 2018-03-07 MED ORDER — HYDROCODONE-ACETAMINOPHEN 7.5-325 MG PO TABS: 1.0000 | ORAL_TABLET | ORAL | 0 refills | Status: AC | PRN

## 2018-03-07 MED ORDER — FENTANYL CITRATE (PF) 100 MCG/2ML IJ SOLN
INTRAMUSCULAR | Status: AC
  Filled 2018-03-07: qty 2

## 2018-03-07 MED ORDER — HYDROMORPHONE HCL 1 MG/ML IJ SOLN
INTRAMUSCULAR | Status: AC
  Filled 2018-03-07: qty 1

## 2018-03-07 MED ORDER — DEXAMETHASONE SODIUM PHOSPHATE 10 MG/ML IJ SOLN
INTRAMUSCULAR | Status: AC
  Filled 2018-03-07: qty 1

## 2018-03-07 MED ORDER — LACTATED RINGERS IV SOLN
INTRAVENOUS | Status: DC
  Filled 2018-03-07: qty 1000

## 2018-03-07 SURGICAL SUPPLY — 37 items
BAG URINE DRAINAGE (UROLOGICAL SUPPLIES) ×4 IMPLANT
BLADE CLIPPER SURG (BLADE) ×4 IMPLANT
CATH FOLEY 2WAY SLVR  5CC 16FR (CATHETERS) ×1
CATH FOLEY 2WAY SLVR 5CC 16FR (CATHETERS) ×3 IMPLANT
CATH ROBINSON RED A/P 16FR (CATHETERS) IMPLANT
CATH ROBINSON RED A/P 20FR (CATHETERS) ×4 IMPLANT
CLOTH BEACON ORANGE TIMEOUT ST (SAFETY) ×4 IMPLANT
CONT SPECI 4OZ STER CLIK (MISCELLANEOUS) ×8 IMPLANT
COVER BACK TABLE 60X90IN (DRAPES) ×4 IMPLANT
COVER MAYO STAND STRL (DRAPES) ×4 IMPLANT
DRSG TEGADERM 4X4.75 (GAUZE/BANDAGES/DRESSINGS) ×4 IMPLANT
DRSG TEGADERM 8X12 (GAUZE/BANDAGES/DRESSINGS) ×8 IMPLANT
GAUZE SPONGE 4X4 12PLY STRL (GAUZE/BANDAGES/DRESSINGS) ×4 IMPLANT
GLOVE BIO SURGEON STRL SZ 6 (GLOVE) IMPLANT
GLOVE BIO SURGEON STRL SZ7 (GLOVE) IMPLANT
GLOVE BIO SURGEON STRL SZ8 (GLOVE) ×4 IMPLANT
GLOVE BIOGEL PI IND STRL 6 (GLOVE) IMPLANT
GLOVE BIOGEL PI IND STRL 8 (GLOVE) IMPLANT
GLOVE BIOGEL PI INDICATOR 6 (GLOVE)
GLOVE BIOGEL PI INDICATOR 8 (GLOVE)
GLOVE ECLIPSE 8.0 STRL XLNG CF (GLOVE) ×4 IMPLANT
GLOVE INDICATOR 7.0 STRL GRN (GLOVE) IMPLANT
GOWN STRL REUS W/TWL XL LVL3 (GOWN DISPOSABLE) ×4 IMPLANT
HOLDER FOLEY CATH W/STRAP (MISCELLANEOUS) IMPLANT
IMPL SPACEOAR SYSTEM 10ML (MISCELLANEOUS) ×3 IMPLANT
IMPLANT SPACEOAR SYSTEM 10ML (MISCELLANEOUS) ×4
ISEED AGX 100 ×336 IMPLANT
IV NS 1000ML (IV SOLUTION) ×1
IV NS 1000ML BAXH (IV SOLUTION) ×3 IMPLANT
KIT TURNOVER CYSTO (KITS) ×4 IMPLANT
MARKER SKIN DUAL TIP RULER LAB (MISCELLANEOUS) ×4 IMPLANT
PACK CYSTO (CUSTOM PROCEDURE TRAY) ×4 IMPLANT
SURGILUBE 2OZ TUBE FLIPTOP (MISCELLANEOUS) ×4 IMPLANT
SYR 10ML LL (SYRINGE) IMPLANT
TOWEL OR 17X24 6PK STRL BLUE (TOWEL DISPOSABLE) ×8 IMPLANT
UNDERPAD 30X30 (UNDERPADS AND DIAPERS) ×8 IMPLANT
WATER STERILE IRR 500ML POUR (IV SOLUTION) ×4 IMPLANT

## 2018-03-07 NOTE — Anesthesia Preprocedure Evaluation (Addendum)
Anesthesia Evaluation  Patient identified by MRN, date of birth, ID band Patient awake    Reviewed: Allergy & Precautions, NPO status , Patient's Chart, lab work & pertinent test results, reviewed documented beta blocker date and time   Airway Mallampati: IV  TM Distance: >3 FB Neck ROM: Full    Dental  (+) Teeth Intact, Dental Advisory Given   Pulmonary sleep apnea and Continuous Positive Airway Pressure Ventilation , former smoker,    breath sounds clear to auscultation       Cardiovascular hypertension, Pt. on medications and Pt. on home beta blockers  Rhythm:Regular Rate:Normal     Neuro/Psych negative neurological ROS     GI/Hepatic negative GI ROS, Neg liver ROS,   Endo/Other  diabetes, Type 2, Oral Hypoglycemic Agents  Renal/GU      Musculoskeletal negative musculoskeletal ROS (+)   Abdominal Normal abdominal exam  (+)   Peds  Hematology   Anesthesia Other Findings - HLD  Reproductive/Obstetrics                           Lab Results  Component Value Date   WBC 4.9 02/28/2018   HGB 13.5 02/28/2018   HCT 39.3 02/28/2018   MCV 90.6 02/28/2018   PLT 209 02/28/2018   Lab Results  Component Value Date   CREATININE 1.11 02/28/2018   BUN 29 (H) 02/28/2018   NA 144 02/28/2018   K 4.6 02/28/2018   CL 108 02/28/2018   CO2 29 02/28/2018   Lab Results  Component Value Date   INR 1.02 02/28/2018   EKG: normal sinus rhythm, 1st degree AV block.    Anesthesia Physical Anesthesia Plan  ASA: II  Anesthesia Plan: General   Post-op Pain Management:    Induction: Intravenous  PONV Risk Score and Plan: Ondansetron, Midazolam and Dexamethasone  Airway Management Planned: LMA  Additional Equipment: None  Intra-op Plan:   Post-operative Plan: Extubation in OR  Informed Consent: I have reviewed the patients History and Physical, chart, labs and discussed the procedure  including the risks, benefits and alternatives for the proposed anesthesia with the patient or authorized representative who has indicated his/her understanding and acceptance.   Dental advisory given  Plan Discussed with: CRNA  Anesthesia Plan Comments:        Anesthesia Quick Evaluation

## 2018-03-07 NOTE — Op Note (Signed)
PATIENT:  Lawrence Foley  PRE-OPERATIVE DIAGNOSIS:  Adenocarcinoma of the prostate  POST-OPERATIVE DIAGNOSIS:  Same  PROCEDURE:  1. I-125 radioactive seed implantation 2. Cystoscopy  3. Placement of SpaceOAR  SURGEON:  Surgeon(s): Claybon Jabs  Radiation oncologist: Dr. Tyler Pita  ANESTHESIA:  General  EBL:  Minimal  DRAINS: None  INDICATION: Lawrence Foley is a 57 year old male with biopsy-proven adenocarcinoma the prostate Gleason 3+3 = 6 in 2 cores from the left lobe initially diagnosed in 12/18.  He had undergone a previous biopsy in 7/18 which revealed atypia and due to his young age and desire to remain on testosterone replacement I recommended a repeat biopsy which discovered the prostate cancer.  He underwent downsizing of his prostate in order to allow radioactive seed implantation as his prostate volume measured 75 cc at the time of his first biopsy and 104 cc at the time of his second biopsy.  He therefore was placed on Lupron for 3 months and underwent repeat prostate ultrasound that revealed the volume had reduced to 40 cc.  He presents today for I-125 seed implant with SpaceOAR.  Description of procedure: After informed consent the patient was brought to the major OR, placed on the table and administered general anesthesia. He was then moved to the modified lithotomy position with his perineum perpendicular to the floor. His perineum and genitalia were then sterilely prepped. An official timeout was then performed. A 16 French Foley catheter was then placed in the bladder and filled with dilute contrast, a rectal tube was placed in the rectum and the transrectal ultrasound probe was placed in the rectum and affixed to the stand. He was then sterilely draped.  Real time ultrasonography was used along with the seed planning software [employer] Prostate vs. 4.2.2.4. This was used to develop the seed plan including the number of needles as well as number of seeds required  for complete and adequate coverage.  The needles were then preloaded with seeds and spacers according to the previously developed plan.  Real-time ultrasonography was then used along with the previously developed plan to implant a total of 84 seeds using 26 needles. This proceeded without difficulty or complication.   I then proceeded with placement of SpaceOAR by introducing a needle with the bevel angled inferiorly approximately 2 cm superior to the anus. This was angled downward and under direct ultrasound was placed within the space between the prostatic capsule and rectum. This was confirmed with a small amount of sterile saline injected and this was performed under direct ultrasound. I then attached the SpaceOAR to the needle and injected this in the space between the prostate and rectum with good placement noted.  A Foley catheter was then removed as well as the transrectal ultrasound probe and rectal probe. Flexible cystoscopy was then performed using the 17 French flexible scope which revealed a normal urethra throughout its length down to the sphincter which appeared intact. The prostatic urethra revealed bilobar hypertrophy but no evidence of obstruction, seeds, spacers or lesions. The bladder was then entered and fully and systematically inspected. The ureteral orifices were noted to be of normal configuration and position. The mucosa revealed no evidence of tumors. There were also no stones identified within the bladder. I noted no seeds or spacers on the floor of the bladder and retroflexion of the scope revealed no seeds protruding from the base of the prostate.  The cystoscope was then removed and the patient was awakened and taken to recovery room  in stable and satisfactory condition. He tolerated procedure well and there were no intraoperative complications.

## 2018-03-07 NOTE — Transfer of Care (Signed)
Immediate Anesthesia Transfer of Care Note  Patient: Lawrence Foley  Procedure(s) Performed: RADIOACTIVE SEED IMPLANT/BRACHYTHERAPY IMPLANT (N/A Prostate) SPACE OAR INSTILLATION (N/A Rectum)  Patient Location: PACU  Anesthesia Type:General  Level of Consciousness: awake and alert   Airway & Oxygen Therapy: Patient Spontanous Breathing and Patient connected to face mask oxygen  Post-op Assessment: Report given to RN and Post -op Vital signs reviewed and stable  Post vital signs: Reviewed and stable  Last Vitals:  Vitals Value Taken Time  BP 142/68 03/07/2018 11:02 AM  Temp    Pulse 71 03/07/2018 11:04 AM  Resp 16 03/07/2018 11:04 AM  SpO2 100 % 03/07/2018 11:04 AM  Vitals shown include unvalidated device data.  Last Pain:  Vitals:   03/07/18 0809  TempSrc:   PainSc: 0-No pain      Patients Stated Pain Goal: 7 (21/97/58 8325)  Complications: No apparent anesthesia complications

## 2018-03-07 NOTE — Anesthesia Postprocedure Evaluation (Signed)
Anesthesia Post Note  Patient: Lawrence Foley  Procedure(s) Performed: RADIOACTIVE SEED IMPLANT/BRACHYTHERAPY IMPLANT (N/A Prostate) SPACE OAR INSTILLATION (N/A Rectum)     Patient location during evaluation: PACU Anesthesia Type: General Level of consciousness: awake and alert Pain management: pain level controlled Vital Signs Assessment: post-procedure vital signs reviewed and stable Respiratory status: spontaneous breathing, nonlabored ventilation, respiratory function stable and patient connected to nasal cannula oxygen Cardiovascular status: blood pressure returned to baseline and stable Postop Assessment: no apparent nausea or vomiting Anesthetic complications: no    Last Vitals:  Vitals:   03/07/18 1145 03/07/18 1245  BP: (!) 149/74 (!) 141/92  Pulse: 70 72  Resp: 18 16  Temp:  36.8 C  SpO2: 94% 95%    Last Pain:  Vitals:   03/07/18 1245  TempSrc:   PainSc: 0-No pain                 Effie Berkshire

## 2018-03-07 NOTE — Discharge Instructions (Signed)

## 2018-03-07 NOTE — Anesthesia Procedure Notes (Signed)
Procedure Name: LMA Insertion Date/Time: 03/07/2018 9:40 AM Performed by: Lieutenant Diego, CRNA Pre-anesthesia Checklist: Patient identified, Emergency Drugs available, Suction available and Patient being monitored Patient Re-evaluated:Patient Re-evaluated prior to induction Oxygen Delivery Method: Circle system utilized Preoxygenation: Pre-oxygenation with 100% oxygen Induction Type: IV induction Ventilation: Mask ventilation without difficulty LMA: LMA inserted LMA Size: 5.0 Number of attempts: 1 Placement Confirmation: positive ETCO2 Tube secured with: Tape Dental Injury: Teeth and Oropharynx as per pre-operative assessment

## 2018-03-10 NOTE — Progress Notes (Signed)
  Radiation Oncology         [phone] ________________________________  Name: JIYAAN STEINHAUSER MRN: [id number]  Date: 03/10/2018  DOB: 1961/03/13       Prostate Seed Implant  TT:[email or web address], [name], MD  No ref. provider found  DIAGNOSIS: 57 y.o. 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 Prostate cancer ([place]) C61 DG Chest 2 View    DG Chest 2 View    Discharge patient    PROCEDURE: Insertion of radioactive I-125 seeds into the prostate gland.  RADIATION DOSE: 145 Gy, definitive therapy.  TECHNIQUE: PRANAV LINCE was brought to the operating room with the urologist. He was placed in the dorsolithotomy position. He was catheterized and a rectal tube was inserted. The perineum was shaved, prepped and draped. The ultrasound probe was then introduced into the rectum to see the prostate gland.  TREATMENT DEVICE: A needle grid was attached to the ultrasound probe stand and anchor needles were placed.  3D PLANNING: The prostate was imaged in 3D using a sagittal sweep of the prostate probe. These images were transferred to the planning computer. There, the prostate, urethra and rectum were defined on each axial reconstructed image. Then, the software created an optimized 3D plan and a few seed positions were adjusted. The quality of the plan was reviewed using [facility] information for the target and the following two organs at risk:  Urethra and Rectum.  Then the accepted plan was printed and handed off to the radiation therapist.  Under my supervision, the custom loading of the seeds and spacers was carried out and loaded into sealed vicryl sleeves.  These pre-loaded needles were then placed into the needle holder.Marland Kitchen  PROSTATE VOLUME STUDY:  Using transrectal ultrasound the volume of the prostate was verified to be 61 cc.  SPECIAL TREATMENT PROCEDURE/SUPERVISION AND HANDLING: The pre-loaded needles were then delivered  under sagittal guidance. A total of 26 needles were used to deposit 84 seeds in the prostate gland. The individual seed activity was 0.526 mCi.  SpaceOAR:  Yes  COMPLEX SIMULATION: At the end of the procedure, an anterior radiograph of the pelvis was obtained to document seed positioning and count. Cystoscopy was performed to check the urethra and bladder.  MICRODOSIMETRY: At the end of the procedure, the patient was emitting 0.054 mR/hr at 1 meter. Accordingly, he was considered safe for hospital discharge.  PLAN: The patient will return to the radiation oncology clinic for post implant CT dosimetry in three weeks.   ________________________________  Sheral Apley Tammi Klippel, M.D.

## 2018-03-11 ENCOUNTER — Encounter (HOSPITAL_BASED_OUTPATIENT_CLINIC_OR_DEPARTMENT_OTHER): Payer: Self-pay | Admitting: Urology

## 2018-03-20 ENCOUNTER — Encounter: Payer: Self-pay | Admitting: Medical Oncology

## 2018-03-20 ENCOUNTER — Ambulatory Visit
Admission: RE | Admit: 2018-03-20 | Discharge: 2018-03-20 | Disposition: A | Discharge status: Home / Self Care | Payer: BLUE CROSS/BLUE SHIELD | Source: Ambulatory Visit | Attending: Radiation Oncology | Admitting: Radiation Oncology

## 2018-03-20 ENCOUNTER — Encounter: Payer: Self-pay | Admitting: Radiation Oncology

## 2018-03-20 ENCOUNTER — Other Ambulatory Visit: Payer: Self-pay

## 2018-03-20 DIAGNOSIS — Z79899 Other long term (current) drug therapy: Secondary | ICD-10-CM | POA: Diagnosis not present

## 2018-03-20 DIAGNOSIS — C61 Malignant neoplasm of prostate: Secondary | ICD-10-CM

## 2018-03-20 DIAGNOSIS — Z923 Personal history of irradiation: Secondary | ICD-10-CM | POA: Insufficient documentation

## 2018-03-20 DIAGNOSIS — Z881 Allergy status to other antibiotic agents status: Secondary | ICD-10-CM | POA: Insufficient documentation

## 2018-03-20 DIAGNOSIS — Z7984 Long term (current) use of oral hypoglycemic drugs: Secondary | ICD-10-CM | POA: Diagnosis not present

## 2018-03-20 NOTE — Progress Notes (Signed)
Mr. Lawrence Foley states he is doing well post seed implant. He experienced dysuria after surgery. He takes AZO which helps. He has follow up 9/19 with Dr. Karsten Ro.

## 2018-03-20 NOTE — Progress Notes (Signed)
Radiation Oncology         [phone] ________________________________  Name: Lawrence Foley MRN: [id number]  Date: 03/20/2018  DOB: 15-Jan-1961  Post-Seed Follow-Up Visit Note  CC: Dione Housekeeper, MD  Kathie Rhodes, MD  Diagnosis:   57 y.o. 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 Malignant neoplasm of prostate ([place]) C61     Interval Since Last Radiation:  2 weeks - Insertion of radioactive I-125 seeds into the prostate gland, 145 Gy definitive therapy, with SpaceOAR  Narrative:  The patient returns today for routine follow-up.  He is complaining of increased urinary frequency and urinary hesitation symptoms. He filled out a questionnaire regarding urinary function today providing an overall IPSS score of 19 characterizing his symptoms as moderate.  His pre-implant score was 9. He reports occasional dysuria managed with AZO and increased water intake. He denies hematuria or urinary leakage/incontinence. He is scheduled for MRI to confirm SpaceOar placement on 03/27/2018. He reports bowel issues and feeling "pressure to defecate" while he is urinating.  ALLERGIES:  is allergic to erythromycin; rosuvastatin; and food.  Meds: Current Outpatient Medications  Medication Sig Dispense Refill  . acetaminophen (TYLENOL) 325 MG tablet Take by mouth as needed.     Marland Kitchen aspirin 81 MG chewable tablet Chew by mouth.    Marland Kitchen CINNAMON PO Take 1,000 mg by mouth 2 (two) times daily.    . ciprofloxacin (CIPRO) 500 MG tablet Take 1 tablet (500 mg total) by mouth 2 (two) times daily. 6 tablet 0  . Cyanocobalamin (VITAMIN B 12 PO) Take 2,500 mcg by mouth every evening.    . fenofibrate micronized (LOFIBRA) 134 MG capsule Take 134 mg by mouth daily before breakfast.    . Ginger, Zingiber officinalis, (GINGER ROOT) 550 MG CAPS Take by mouth.    Marland Kitchen glipiZIDE (GLUCOTROL XL) 10 MG 24 hr tablet Take by mouth.    Marland Kitchen glucose blood  (ONETOUCH VERIO) test strip USE TO TEST ONCE A DAY    . HYDROcodone-acetaminophen (NORCO) 7.5-325 MG tablet Take 1-2 tablets by mouth every 4 (four) hours as needed for moderate pain. Maximum dose per 24 hours - 8 pills 12 tablet 0  . L-Lysine 500 MG CAPS Take by mouth daily.    . metFORMIN (GLUCOPHAGE-XR) 500 MG 24 hr tablet TAKE 1 TABLET TWICE A DAY    . Misc. Devices MISC One touch verios syn machine testing qd. Order diabetic testing strips and calibration. Lancets and solution with 3 month supply. Dx 250.02 with 4 refills    . Misc. Devices MISC Syringes box of 25 with 4refills dx hypgonadism, syringe size 3cc 23gx1    . Misc. Devices MISC Please change pressure from auto adjust to constant 11    . nebivolol (BYSTOLIC) 10 MG tablet TAKE 2 TABLETS DAILY    . niacin 500 MG tablet Take 500 mg by mouth at bedtime.    . Olmesartan-Amlodipine-HCTZ 40-5-12.5 MG TABS TAKE 1 TABLET DAILY    . ONETOUCH DELICA LANCETS FINE MISC USE TO TEST ONCE A DAY    . Pitavastatin Calcium (LIVALO) 4 MG TABS Take by mouth.    . Respiratory Therapy Supplies DEVI CPAP at bedtime    . testosterone cypionate (DEPOTESTOSTERONE CYPIONATE) 200 MG/ML injection Inject into the muscle.     No current facility-administered medications for this encounter.     Physical Findings: In general this  is a well appearing caucasian male in no acute distress. He's alert and oriented x4 and appropriate throughout the examination. Cardiopulmonary assessment is negative for acute distress and he exhibits normal effort.   Lab Findings: Lab Results  Component Value Date   WBC 4.9 02/28/2018   HGB 12.6 (L) 03/07/2018   HCT 37.0 (L) 03/07/2018   MCV 90.6 02/28/2018   PLT 209 02/28/2018    Radiographic Findings:  Patient underwent CT imaging in our clinic for post implant dosimetry. The CT was reviewed and appears to demonstrate an adequate distribution of radioactive seeds throughout the prostate gland. There are no seeds in or near  the rectum. We suspect the final radiation plan and dosimetry will show appropriate coverage of the prostate gland.   Impression/Plan: The patient is recovering from the effects of radiation. His urinary symptoms should gradually improve over the next 4-6 months. We talked about this today. He is encouraged by his improvement already and is otherwise pleased with his outcome. We also talked about long-term follow-up for prostate cancer following seed implant. He understands that ongoing PSA determinations and digital rectal exams will help perform surveillance to rule out disease recurrence. He has a follow up appointment scheduled with Dr. Karsten Ro on 03/27/2018 and MRI scheduled for that same day. He understands what to expect with his PSA measures. Patient was also educated today about some of the long-term effects from radiation including a small risk for rectal bleeding and possibly erectile dysfunction. We talked about some of the general management approaches to these potential complications. However, I did encourage the patient to contact our office or return at any point if he has questions or concerns related to his previous radiation and prostate cancer.  The patient's blood glucose level is high. He was advised to see his PCP regarding management of this.    Nicholos Johns, PA-C    Tyler Pita, MD  [facility] Direct Dial: [phone]  Fax: [phone] [email or web address]  Skype  LinkedIn    Page Me   This document serves as a record of services personally performed by Tyler Pita, MD and Freeman Caldron, PA-C. It was created on their behalf by Rae Lips, a trained medical scribe. The creation of this record is based on the scribe's personal observations and the providers' statements to them. This document has been checked and approved by the attending providers.

## 2018-03-20 NOTE — Progress Notes (Signed)
Pre seed IPSS 9. Post seed IPSS 19. Reports occasional dysuria managed with AZO and increased water intake. Denies hematuria or urinary leakage/incontinence. Scheduled to follow up with urologist next Thursday. Also, scheduled for MRI to confirm SpaceOar placement next Thursday.   BP 140/77 (BP Location: Right Arm, Patient Position: Sitting)   Pulse 60   Temp 97.8 F (36.6 C) (Oral)   Resp 20   Ht 6\' 5"  (1.956 m)   Wt (!) 316 lb 3.2 oz (143.4 kg)   SpO2 98%   BMI 37.50 kg/m  Wt Readings from Last 3 Encounters:  03/20/18 (!) 316 lb 3.2 oz (143.4 kg)  03/07/18 (!) 312 lb 9.6 oz (141.8 kg)  08/05/17 (!) 313 lb 6.4 oz (142.2 kg)

## 2018-03-20 NOTE — Progress Notes (Signed)
  Radiation Oncology         [phone]) [phone] ________________________________  Name: Lawrence Foley MRN: [id number]  Date: 03/20/2018  DOB: 05/29/1961  COMPLEX SIMULATION NOTE  NARRATIVE:  The patient was brought to the [facility] today following prostate seed implantation approximately one month ago.  Identity was confirmed.  All relevant records and images related to the planned course of therapy were reviewed.  Then, the patient was set-up supine.  CT images were obtained.  The CT images were loaded into the planning software.  Then the prostate and rectum were contoured.  Treatment planning then occurred.  The implanted iodine 125 seeds were identified by the physics staff for projection of radiation distribution  I have requested : 3D Simulation  I have requested a DVH of the following structures: Prostate and rectum.    ________________________________  Sheral Apley Tammi Klippel, M.D.  This document serves as a record of services personally performed by Tyler Pita, MD. It was created on his behalf by Rae Lips, a trained medical scribe. The creation of this record is based on the scribe's personal observations and the provider's statements to them. This document has been checked and approved by the attending provider.

## 2018-03-21 ENCOUNTER — Ambulatory Visit (HOSPITAL_COMMUNITY): Payer: BLUE CROSS/BLUE SHIELD

## 2018-03-27 ENCOUNTER — Encounter (HOSPITAL_COMMUNITY): Payer: Self-pay

## 2018-03-27 ENCOUNTER — Ambulatory Visit (HOSPITAL_COMMUNITY)
Admission: RE | Admit: 2018-03-27 | Discharge: 2018-03-27 | Disposition: A | Discharge status: Home / Self Care | Payer: BLUE CROSS/BLUE SHIELD | Source: Ambulatory Visit | Attending: Urology | Admitting: Urology

## 2018-03-27 DIAGNOSIS — C61 Malignant neoplasm of prostate: Secondary | ICD-10-CM

## 2018-03-27 NOTE — Progress Notes (Signed)
Attempted patient for MRI, patient did not clear bore of scanner. Patient needs to be scanned in Wide Bore scanner at [facility].  BHJ

## 2018-04-01 ENCOUNTER — Other Ambulatory Visit: Payer: Self-pay | Admitting: Urology

## 2018-04-01 ENCOUNTER — Telehealth: Payer: Self-pay | Admitting: *Deleted

## 2018-04-01 DIAGNOSIS — C61 Malignant neoplasm of prostate: Secondary | ICD-10-CM

## 2018-04-01 NOTE — Telephone Encounter (Signed)
Called patient to inform of MRI for 04-09-18 - arrival time - 4:30 pm @ [facility], no restrictions to test, spoke with patient and he is aware of this test

## 2018-04-09 ENCOUNTER — Ambulatory Visit (HOSPITAL_COMMUNITY)
Admission: RE | Admit: 2018-04-09 | Discharge: 2018-04-09 | Disposition: A | Discharge status: Home / Self Care | Payer: BLUE CROSS/BLUE SHIELD | Source: Ambulatory Visit | Attending: Urology | Admitting: Urology

## 2018-04-09 DIAGNOSIS — C61 Malignant neoplasm of prostate: Secondary | ICD-10-CM | POA: Diagnosis present

## 2018-04-17 ENCOUNTER — Encounter: Payer: Self-pay | Admitting: Radiation Oncology

## 2018-05-05 NOTE — Progress Notes (Signed)
  Radiation Oncology         [phone] ________________________________  Name: Lawrence Foley MRN: [id number]  Date: 04/17/2018  DOB: May 28, 1961  3D Planning Note   Prostate Brachytherapy Post-Implant Dosimetry  Diagnosis: 57 y.o. gentleman with low risk Stage T1c adenocarcinoma of the prostate with Gleason Score of 3+3, and PSA of 3.42  Narrative: On a previous date, Lawrence Foley returned following prostate seed implantation for post implant planning. He underwent CT scan complex simulation to delineate the three-dimensional structures of the pelvis and demonstrate the radiation distribution.  Since that time, the seed localization, and complex isodose planning with dose volume histograms have now been completed.  Results:   Prostate Coverage - The dose of radiation delivered to the 90% or more of the prostate gland (D90) was 112.05% of the prescription dose. This exceeds our goal of greater than 90%. Rectal Sparing - The volume of rectal tissue receiving the prescription dose or higher was 0.0 cc. This falls under our thresholds tolerance of 1.0 cc.  Impression: The prostate seed implant appears to show adequate target coverage and appropriate rectal sparing.  Plan:  The patient will continue to follow with urology for ongoing PSA determinations. I would anticipate a high likelihood for local tumor control with minimal risk for rectal morbidity.  ________________________________  Sheral Apley Tammi Klippel, M.D.

## 2018-07-25 NOTE — Telephone Encounter (Signed)
XXXX 

## 2019-06-16 ENCOUNTER — Other Ambulatory Visit: Payer: Self-pay

## 2019-06-16 DIAGNOSIS — Z20822 Contact with and (suspected) exposure to covid-19: Secondary | ICD-10-CM

## 2019-06-18 LAB — NOVEL CORONAVIRUS, NAA: SARS-CoV-2, NAA: NOT DETECTED

## 2019-06-23 IMAGING — CR DG CHEST 2V
2 series · 2 of 2 positions shown · non-contrast
Comparison: None.

CLINICAL DATA: Preop seed implant

EXAM:
CHEST - 2 VIEW

[w chest pa]
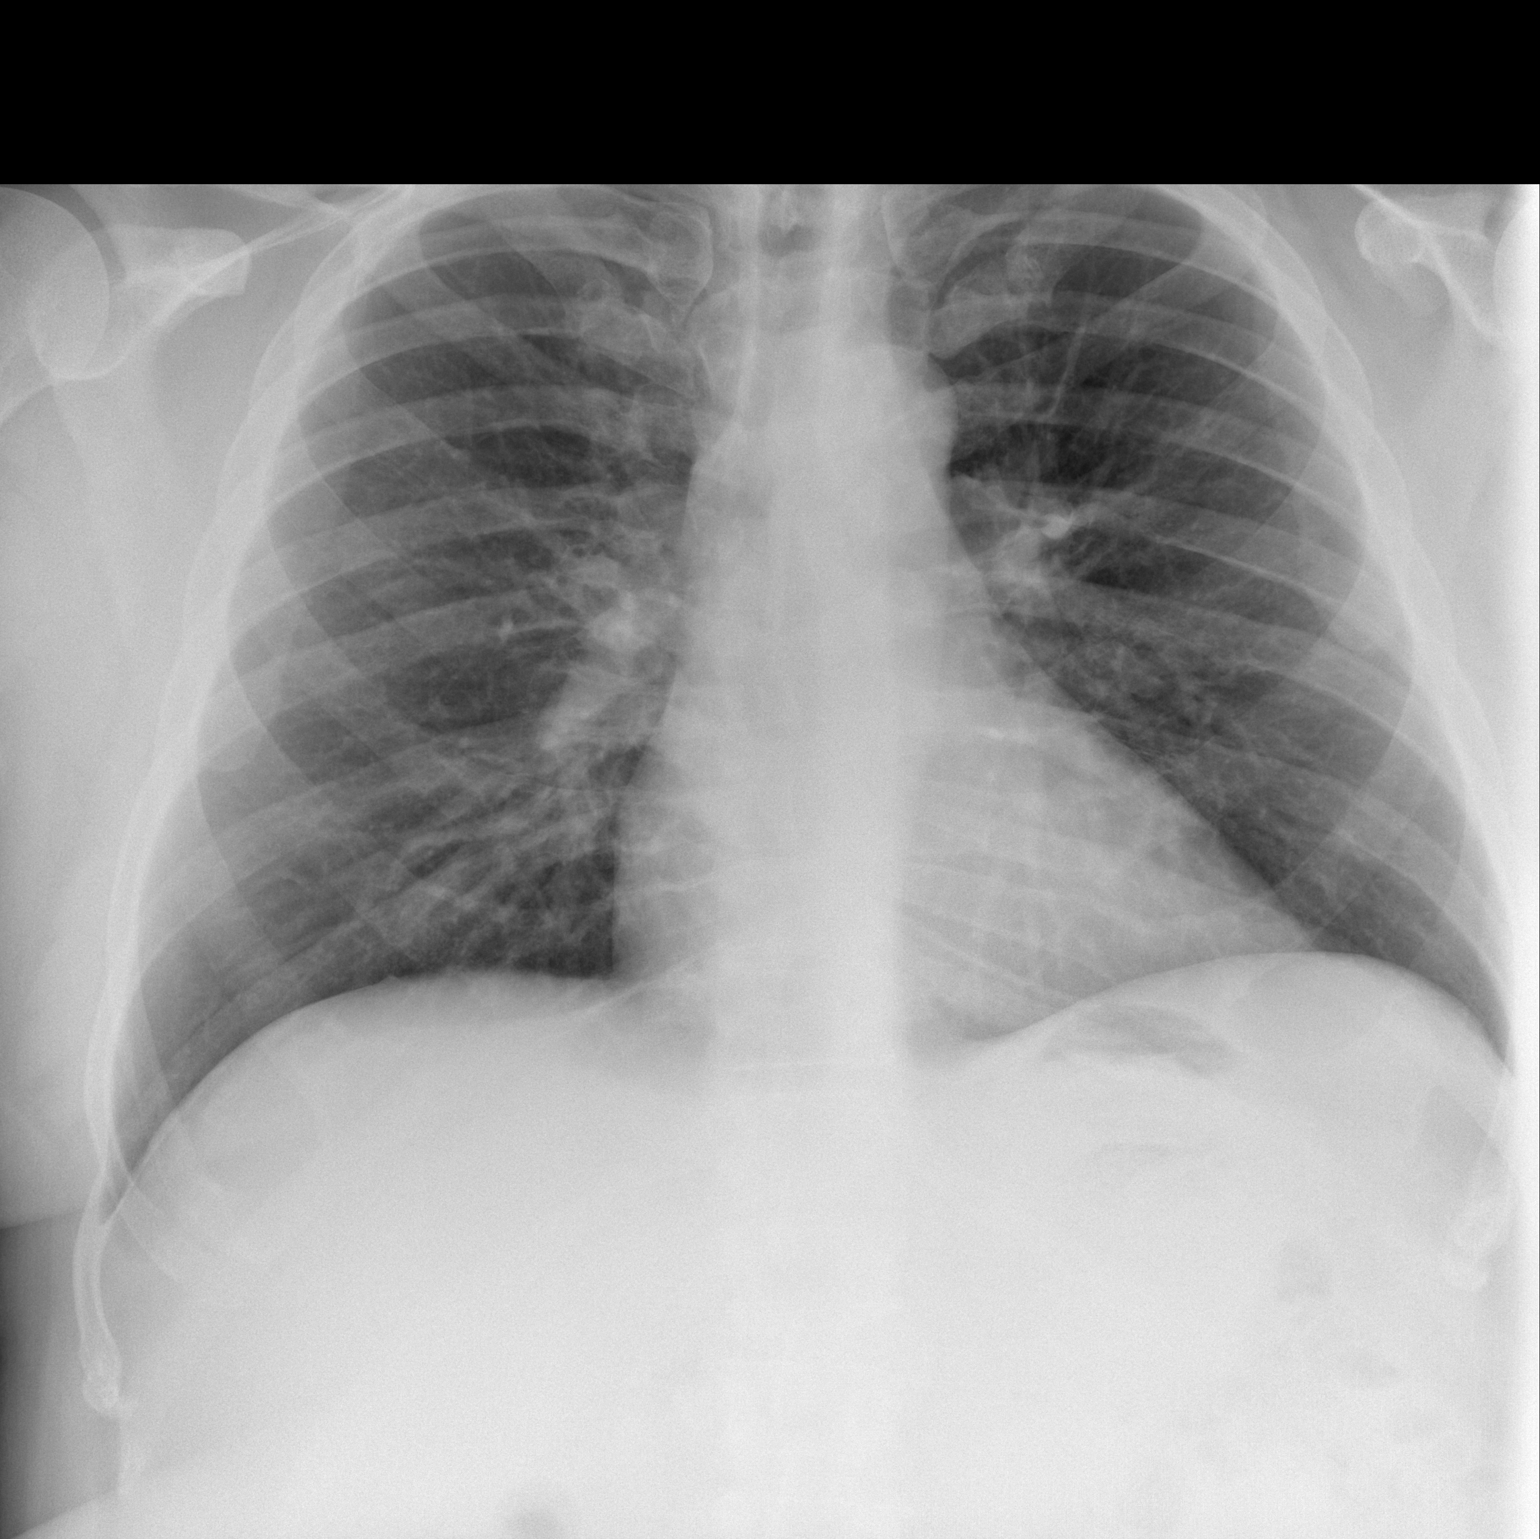

[w chest lat]
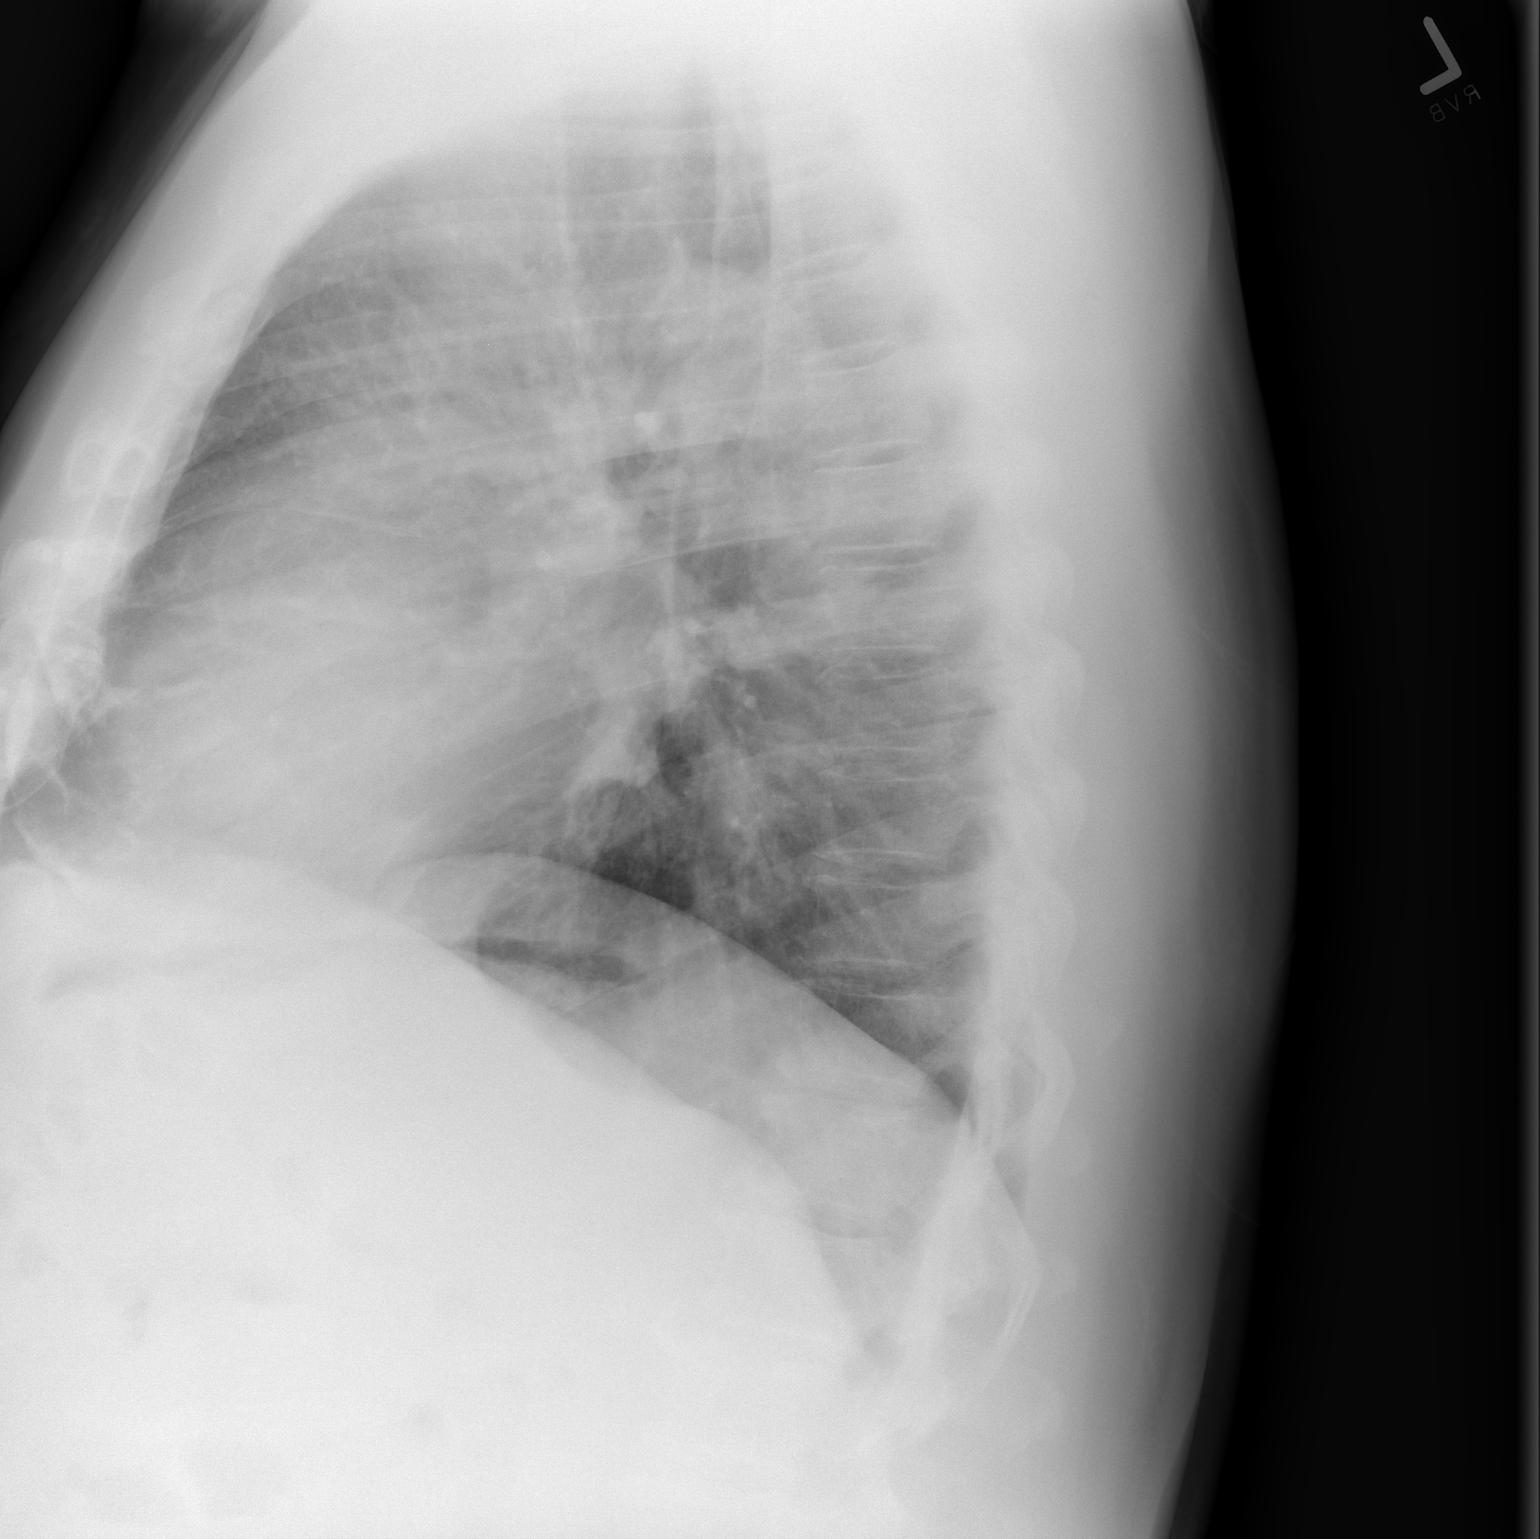

[2 of 2 positions shown; findings below may reference images not displayed]

FINDINGS: The heart size and mediastinal contours are within normal limits.
Both lungs are clear. The visualized skeletal structures are
unremarkable.
IMPRESSION: No active cardiopulmonary disease.
